# Patient Record
Sex: Female | Born: 1965 | Race: White | Hispanic: No | Marital: Married | Smoking: Never smoker
Health system: Southern US, Community
[De-identification: ages and names within clinical notes are randomized; demographics above are authoritative.]

---

## 2007-04-02 ENCOUNTER — Emergency Department: Payer: Self-pay | Admitting: Unknown Physician Specialty

## 2007-04-23 ENCOUNTER — Encounter: Payer: Self-pay | Admitting: Maternal & Fetal Medicine

## 2007-07-24 ENCOUNTER — Ambulatory Visit: Payer: Self-pay

## 2007-09-29 ENCOUNTER — Inpatient Hospital Stay: Payer: Self-pay | Admitting: Obstetrics and Gynecology

## 2009-06-29 DIAGNOSIS — Z833 Family history of diabetes mellitus: Secondary | ICD-10-CM | POA: Insufficient documentation

## 2015-11-05 ENCOUNTER — Other Ambulatory Visit: Payer: Self-pay | Admitting: Family Medicine

## 2015-11-05 ENCOUNTER — Telehealth: Payer: Self-pay | Admitting: Family Medicine

## 2015-11-05 DIAGNOSIS — G43909 Migraine, unspecified, not intractable, without status migrainosus: Secondary | ICD-10-CM

## 2015-11-05 MED ORDER — SUMATRIPTAN SUCCINATE 100 MG PO TABS: ORAL_TABLET | ORAL | Status: DC

## 2015-11-05 NOTE — Telephone Encounter (Signed)
Pt contacted office for refill request on the following medications:  Imitrex 100 mg to [facility] on [address] Pt stated that she is completely out of the medication and she doesn't think she can make it through Christmas with her head ache and no medication. Thanks TNP

## 2015-11-05 NOTE — Telephone Encounter (Signed)
Not in our system epic or alscripts-aa

## 2015-11-05 NOTE — Telephone Encounter (Signed)
Refills sent in

## 2015-11-05 NOTE — Telephone Encounter (Signed)
This is our pt she was last seen on 11/25/13. Pt was Miranda LoboDeena Rangel she is now Miranda Rangel b/c she remarried. She is in [employer] as Miranda Rangel.  I updated her info when she called this morning for the refill. When I spoke with pt this morning she stated that she would schedule an appt after the new year but would like enough to get her through Christmas. Thanks TNP

## 2015-11-05 NOTE — Telephone Encounter (Signed)
Please advise. KW 

## 2015-11-05 NOTE — Telephone Encounter (Signed)
I don't see that she is a patient here.

## 2016-08-24 ENCOUNTER — Other Ambulatory Visit: Payer: Self-pay | Admitting: Family Medicine

## 2016-08-24 DIAGNOSIS — G43909 Migraine, unspecified, not intractable, without status migrainosus: Secondary | ICD-10-CM

## 2016-08-24 NOTE — Telephone Encounter (Signed)
Provider is out of office can you please review over request? Thank you. KW 

## 2017-02-27 ENCOUNTER — Other Ambulatory Visit: Payer: Self-pay | Admitting: Physician Assistant

## 2017-02-27 DIAGNOSIS — G43909 Migraine, unspecified, not intractable, without status migrainosus: Secondary | ICD-10-CM

## 2019-01-17 ENCOUNTER — Other Ambulatory Visit: Payer: Self-pay | Admitting: Obstetrics and Gynecology

## 2019-01-17 DIAGNOSIS — Z1231 Encounter for screening mammogram for malignant neoplasm of breast: Secondary | ICD-10-CM

## 2019-01-22 ENCOUNTER — Encounter: Payer: Self-pay | Admitting: Radiology

## 2019-01-22 ENCOUNTER — Ambulatory Visit
Admission: RE | Admit: 2019-01-22 | Discharge: 2019-01-22 | Disposition: A | Discharge status: Home / Self Care | Source: Ambulatory Visit | Attending: Obstetrics and Gynecology | Admitting: Obstetrics and Gynecology

## 2019-01-22 DIAGNOSIS — Z1231 Encounter for screening mammogram for malignant neoplasm of breast: Secondary | ICD-10-CM | POA: Diagnosis not present

## 2019-08-06 IMAGING — MG DIGITAL SCREENING BILATERAL MAMMOGRAM WITH TOMO AND CAD
6 of 10 series · 6 of 30 positions shown · non-contrast
Comparison: 07/08/2015 and 12/25/2012 from [facility].

CLINICAL DATA: Screening.

EXAM:
DIGITAL SCREENING BILATERAL MAMMOGRAM WITH TOMO AND CAD

[R MLO synth-2D (1 of 2)]
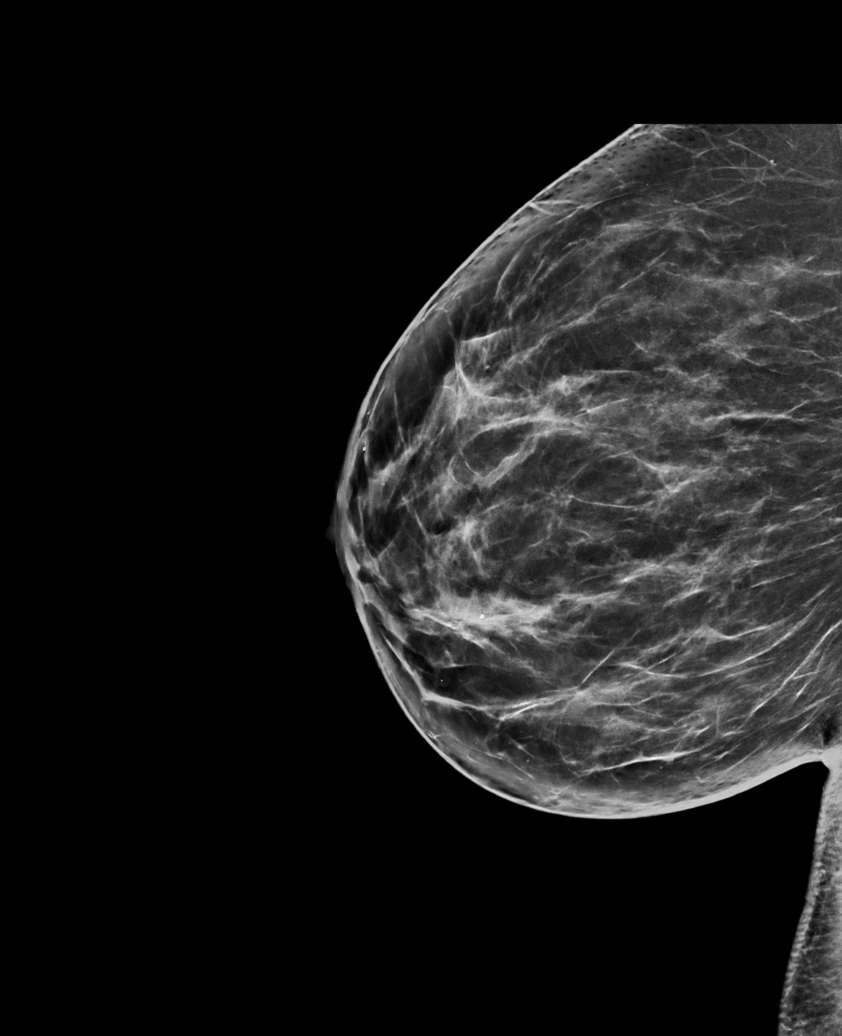

[L MLO synth-2D]
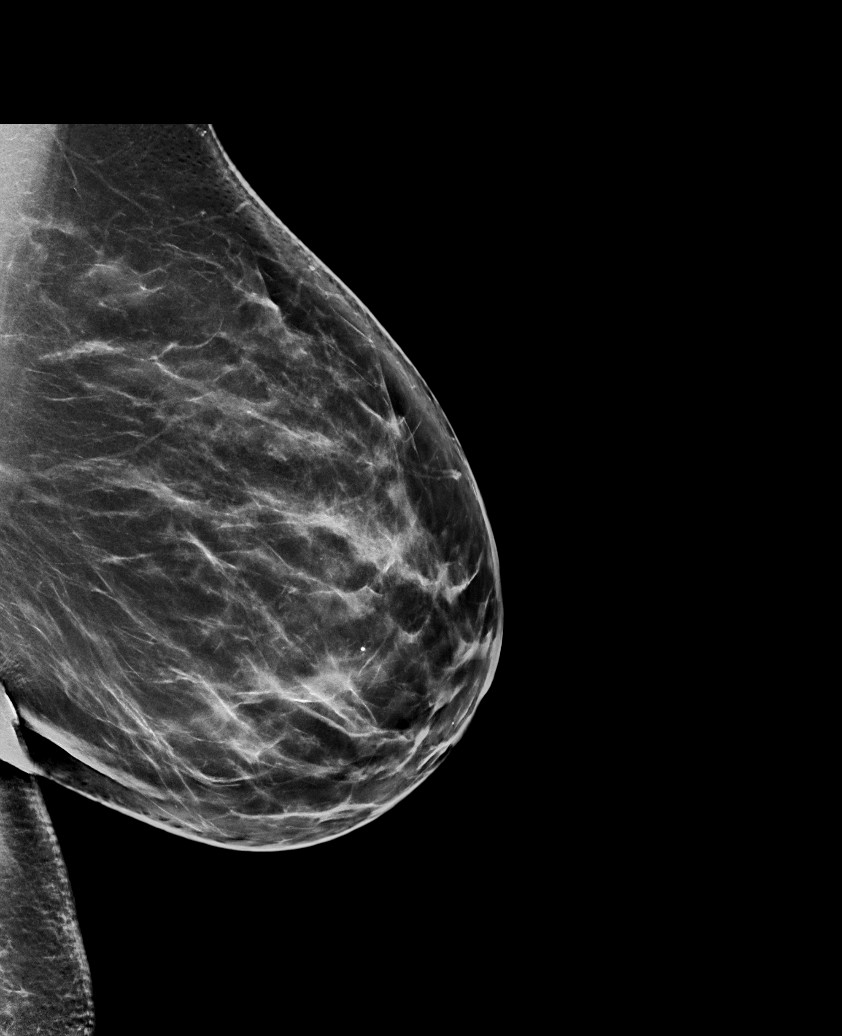

[R MLO synth-2D (2 of 2)]
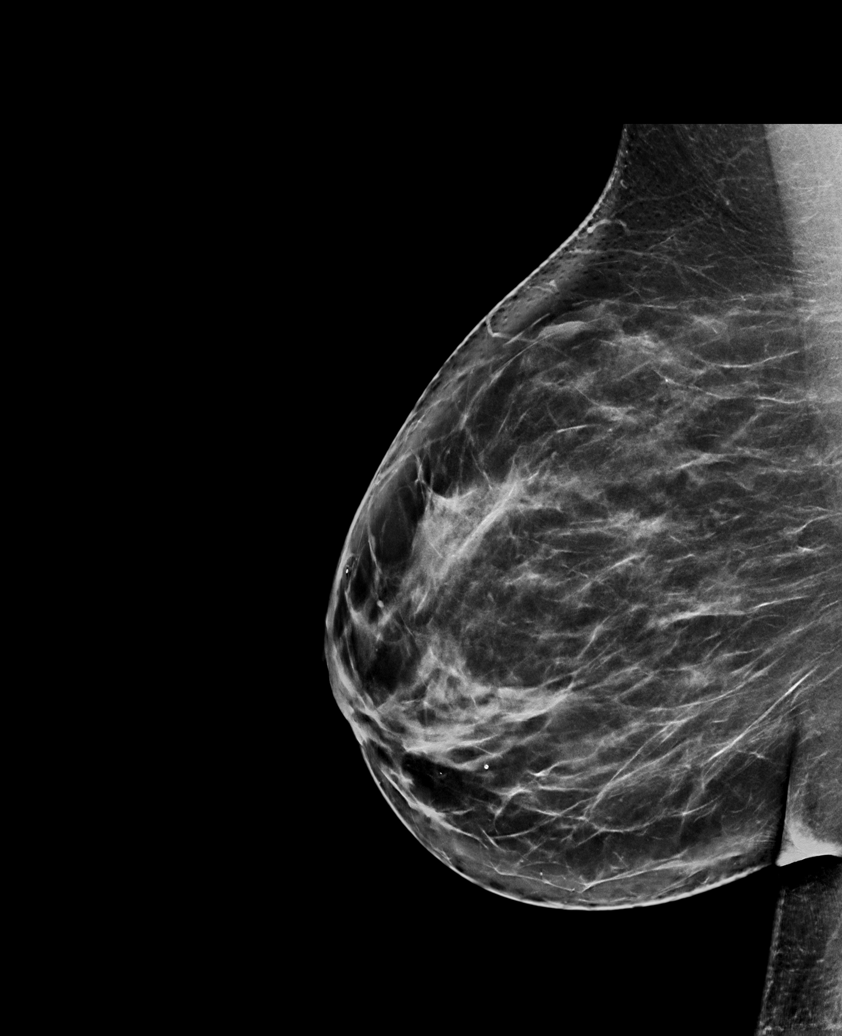

[L CC synth-2D]
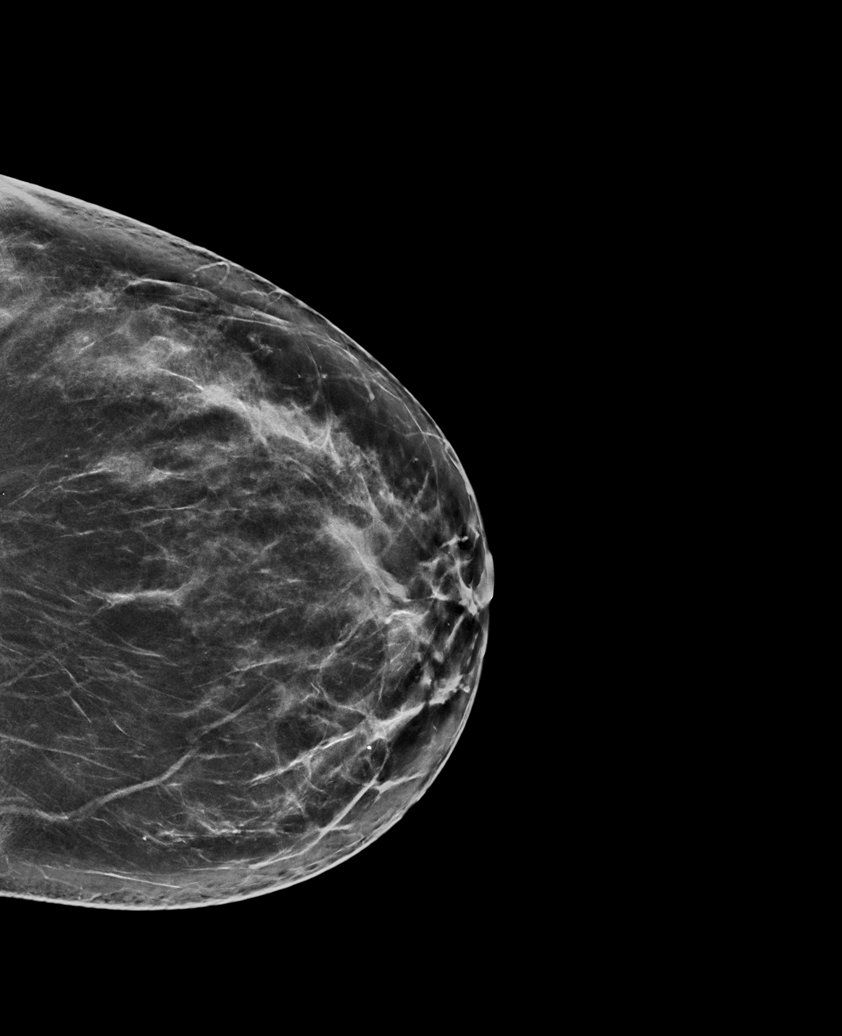

[R CC synth-2D]
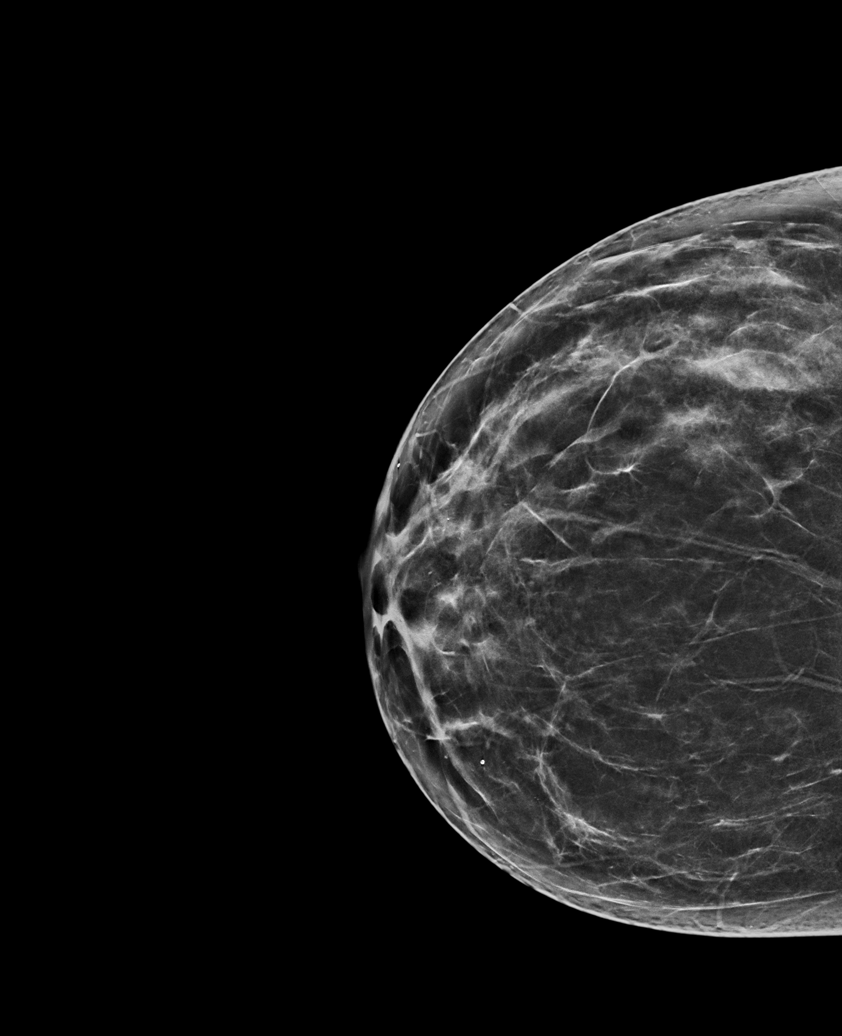

[L MLO tomo · tomo slice 47/93.0]
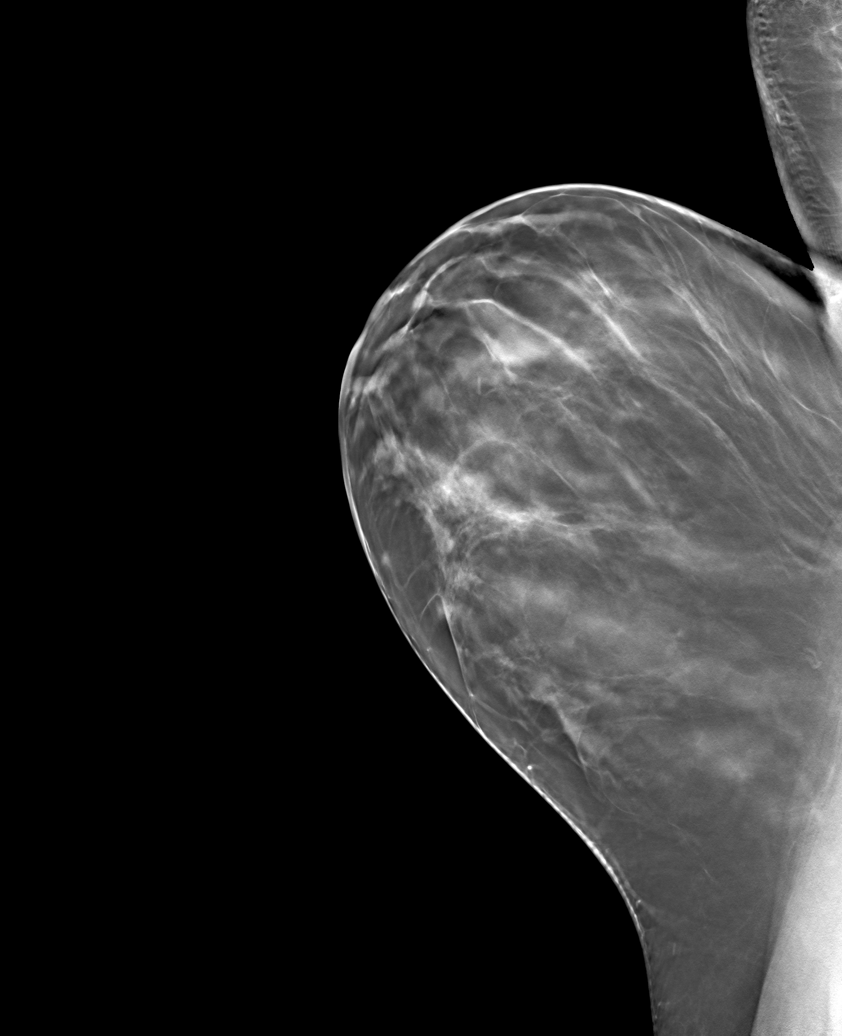

[6 of 30 positions shown; findings below may reference images not displayed]

Awaiting
the prior outside mammograms accounts for the delay in this report.

ACR Breast Density Category c: The breast tissue is heterogeneously
dense, which may obscure small masses.
FINDINGS: There are no findings suspicious for malignancy. Images were
processed with CAD.
IMPRESSION: No mammographic evidence of malignancy. A result letter of this
screening mammogram will be mailed directly to the patient.

RECOMMENDATION:
Screening mammogram in one year. (Code:YZ-[id number])

BI-RADS CATEGORY  1: Negative.

## 2019-08-27 ENCOUNTER — Other Ambulatory Visit: Payer: Self-pay

## 2019-08-27 DIAGNOSIS — Z20822 Contact with and (suspected) exposure to covid-19: Secondary | ICD-10-CM

## 2019-08-29 LAB — NOVEL CORONAVIRUS, NAA: SARS-CoV-2, NAA: NOT DETECTED

## 2020-03-08 ENCOUNTER — Emergency Department
Admission: EM | Admit: 2020-03-08 | Discharge: 2020-03-08 | Disposition: A | Discharge status: Home / Self Care | Attending: Emergency Medicine | Admitting: Emergency Medicine

## 2020-03-08 ENCOUNTER — Other Ambulatory Visit: Payer: Self-pay

## 2020-03-08 DIAGNOSIS — R509 Fever, unspecified: Secondary | ICD-10-CM | POA: Insufficient documentation

## 2020-03-08 DIAGNOSIS — R110 Nausea: Secondary | ICD-10-CM | POA: Insufficient documentation

## 2020-03-08 DIAGNOSIS — R109 Unspecified abdominal pain: Secondary | ICD-10-CM

## 2020-03-08 LAB — COMPREHENSIVE METABOLIC PANEL
ALT: 30 U/L (ref 0–44)
AST: 20 U/L (ref 15–41)
Albumin: 4.1 g/dL (ref 3.5–5.0)
Alkaline Phosphatase: 48 U/L (ref 38–126)
Anion gap: 9 (ref 5–15)
BUN: 11 mg/dL (ref 6–20)
CO2: 22 mmol/L (ref 22–32)
Calcium: 8.9 mg/dL (ref 8.9–10.3)
Chloride: 106 mmol/L (ref 98–111)
Creatinine, Ser: 0.9 mg/dL (ref 0.44–1.00)
GFR calc Af Amer: >60 mL/min (ref >60)
GFR calc non Af Amer: >60 mL/min (ref >60)
Glucose, Bld: 121 mg/dL — ABNORMAL HIGH (ref 70–99)
Potassium: 3.8 mmol/L (ref 3.5–5.1)
Sodium: 137 mmol/L (ref 135–145)
Total Bilirubin: 0.9 mg/dL (ref 0.3–1.2)
Total Protein: 8 g/dL (ref 6.5–8.1)

## 2020-03-08 LAB — CBC
HCT: 45.8 % (ref 36.0–46.0)
Hemoglobin: 15.7 g/dL — ABNORMAL HIGH (ref 12.0–15.0)
MCH: 27.4 pg (ref 26.0–34.0)
MCHC: 34.3 g/dL (ref 30.0–36.0)
MCV: 80.1 fL (ref 80.0–100.0)
Platelets: 377 10*3/uL (ref 150–400)
RBC: 5.72 MIL/uL — ABNORMAL HIGH (ref 3.87–5.11)
RDW: 13.4 % (ref 11.5–15.5)
WBC: 11.7 10*3/uL — ABNORMAL HIGH (ref 4.0–10.5)
nRBC: 0 % (ref 0.0–0.2)

## 2020-03-08 LAB — LIPASE, BLOOD: Lipase: 24 U/L (ref 11–51)

## 2020-03-08 MED ORDER — MORPHINE SULFATE (PF) 4 MG/ML IV SOLN
4.0000 mg | Freq: Once | INTRAVENOUS | Status: DC
  Filled 2020-03-08: qty 1

## 2020-03-08 MED ORDER — ONDANSETRON HCL 4 MG PO TABS: 4.0000 mg | ORAL_TABLET | Freq: Every day | ORAL | 1 refills | Status: AC | PRN

## 2020-03-08 MED ORDER — ONDANSETRON 4 MG PO TBDP
4.0000 mg | ORAL_TABLET | Freq: Once | ORAL | Status: AC
  Administered 2020-03-08: 4 mg via ORAL
  Filled 2020-03-08: qty 1

## 2020-03-08 MED ORDER — SODIUM CHLORIDE 0.9 % IV SOLN: Freq: Once | INTRAVENOUS | Status: DC

## 2020-03-08 MED ORDER — OXYCODONE-ACETAMINOPHEN 5-325 MG PO TABS
2.0000 | ORAL_TABLET | Freq: Once | ORAL | Status: AC
  Administered 2020-03-08: 2 via ORAL
  Filled 2020-03-08: qty 2

## 2020-03-08 MED ORDER — ONDANSETRON HCL 4 MG/2ML IJ SOLN
4.0000 mg | Freq: Once | INTRAMUSCULAR | Status: DC
  Filled 2020-03-08: qty 2

## 2020-03-08 NOTE — ED Notes (Signed)
This RN attempted to insert IV and give ordered medications, pt refused medication and IV, states that she doesn't need CT scan and believes that she only has a stomach bug. This RN notified Dr. Mayford Knife.

## 2020-03-08 NOTE — ED Provider Notes (Signed)
[facility] Emergency Department Provider Note       Time seen: ----------------------------------------- 7:56 PM on 03/08/2020 -----------------------------------------   I have reviewed the triage vital signs and the nursing notes.  HISTORY   Chief Complaint Abdominal Pain    HPI Miranda Rangel is a 54 y.o. female with no known past medical history who presents to the ED for severe left-sided flank pain with nausea and fever.  Patient states it feels as though she needs to have a bowel movement.  Discomfort is 5 out of 10 that she describes as cramping and constant.  History reviewed. No pertinent past medical history.  Patient Active Problem List   Diagnosis Date Noted  . Family history of diabetes mellitus 06/29/2009  . Acute onset aura migraine 06/29/2009  . Tobacco use 06/29/2009    History reviewed. No pertinent surgical history.  Allergies Penicillins and Sulfa antibiotics  Social History Social History   Tobacco Use  . Smoking status: Never Smoker  . Smokeless tobacco: Never Used  Substance Use Topics  . Alcohol use: Never  . Drug use: Never    Review of Systems Constitutional: Positive for fever Cardiovascular: Negative for chest pain. Respiratory: Negative for shortness of breath. Gastrointestinal: Positive for abdominal pain and nausea Musculoskeletal: Positive for back pain Skin: Negative for rash. Neurological: Negative for headaches, focal weakness or numbness.  All systems negative/normal/unremarkable except as stated in the HPI  ____________________________________________   PHYSICAL EXAM:  VITAL SIGNS: ED Triage Vitals  Enc Vitals Group     BP 03/08/20 1738 (!) 150/97     Pulse Rate 03/08/20 1738 (!) 134     Resp 03/08/20 1738 20     Temp 03/08/20 1738 98.8 F (37.1 C)     Temp Source 03/08/20 1738 Oral     SpO2 03/08/20 1738 96 %     Weight 03/08/20 1747 190 lb (86.2 kg)     Height 03/08/20 1747 5'  4" (1.626 m)     Head Circumference --      Peak Flow --      Pain Score 03/08/20 1747 5     Pain Loc --      Pain Edu? --      Excl. in [place]? --     Constitutional: Alert and oriented. Well appearing and in no distress. Eyes: Conjunctivae are normal. Normal extraocular movements. Cardiovascular: Rapid rate, regular rhythm. No murmurs, rubs, or gallops. Respiratory: Normal respiratory effort without tachypnea nor retractions. Breath sounds are clear and equal bilaterally. No wheezes/rales/rhonchi. Gastrointestinal: Left flank tenderness, no rebound or guarding.  Normal bowel sounds. Musculoskeletal: Nontender with normal range of motion in extremities. No lower extremity tenderness nor edema. Neurologic:  Normal speech and language. No gross focal neurologic deficits are appreciated.  Skin:  Skin is warm, dry and intact. No rash noted. Psychiatric: Mood and affect are normal. Speech and behavior are normal.  ____________________________________________  EKG: Interpreted by me.  Sinus tachycardia with a rate of 132 bpm, left bundle branch block, normal axis, borderline QT  ____________________________________________  ED COURSE:  As part of my medical decision making, I reviewed the following data within the [place] History obtained from family if available, nursing notes, old chart and ekg, as well as notes from prior ED visits. Patient presented for flank pain, we will assess with labs and imaging as indicated at this time.   Procedures  Arraya Joanna Borawski was evaluated in Emergency Department on 03/08/2020  for the symptoms described in the history of present illness. She was evaluated in the context of the global COVID-19 pandemic, which necessitated consideration that the patient might be at risk for infection with the SARS-CoV-2 virus that causes COVID-19. Institutional protocols and algorithms that pertain to the evaluation of patients at risk for COVID-19 are in  a state of rapid change based on information released by regulatory bodies including the CDC and federal and state organizations. These policies and algorithms were followed during the patient's care in the ED.  ____________________________________________   LABS (pertinent positives/negatives)  Labs Reviewed  COMPREHENSIVE METABOLIC PANEL - Abnormal; Notable for the following components:      Result Value   Glucose, Bld 121 (*)    All other components within normal limits  CBC - Abnormal; Notable for the following components:   WBC 11.7 (*)    RBC 5.72 (*)    Hemoglobin 15.7 (*)    All other components within normal limits  LIPASE, BLOOD  URINALYSIS, COMPLETE (UACMP) WITH MICROSCOPIC    RADIOLOGY  CT of the abdomen and pelvis with contrast was ordered Patient has declined her CT ____________________________________________   DIFFERENTIAL DIAGNOSIS   Renal colic, UTI, pyelonephritis, diverticulitis, AAA, colitis  FINAL ASSESSMENT AND PLAN  Flank pain   Plan: The patient had presented for left-sided flank pain with nausea and fever. Patient's labs did reveal mild leukocytosis. Patient's imaging was declined by the patient because she is self-pay.  She is tachycardic and seem to be having significant symptoms but she has declined any further testing at this point.  I have advised that she return for worsening or worrisome symptoms.   Ulice Dash, MD    Note: This note was generated in part or whole with voice recognition software. Voice recognition is usually quite accurate but there are transcription errors that can and very often do occur. I apologize for any typographical errors that were not detected and corrected.     Emily Filbert, MD 03/08/20 2138

## 2020-03-08 NOTE — ED Triage Notes (Signed)
Pt reports abd pain since 12noon with nausea and fever - the abd pain is centered around her umbilicus and makes pt feel as though she has to have a BM - Pt reports that the pain radiates straight through to the center of her back

## 2020-03-08 NOTE — ED Notes (Signed)
Pt observed in lobby drinking mountain dew. No distress noted at this time

## 2022-12-21 ENCOUNTER — Telehealth: Payer: Self-pay

## 2022-12-21 NOTE — Telephone Encounter (Signed)
Called pt to confirm appointment for tomorrow.  Left message for pt to call and confirm appointment by 1500 12/23/22

## 2022-12-23 ENCOUNTER — Ambulatory Visit: Admitting: Family Medicine

## 2022-12-23 ENCOUNTER — Encounter: Payer: Self-pay | Admitting: Family Medicine

## 2022-12-23 VITALS — BP 147/86 | HR 82 | Wt 192.2 lb

## 2022-12-23 DIAGNOSIS — Z1231 Encounter for screening mammogram for malignant neoplasm of breast: Secondary | ICD-10-CM | POA: Diagnosis not present

## 2022-12-23 DIAGNOSIS — Z79890 Hormone replacement therapy (postmenopausal): Secondary | ICD-10-CM

## 2022-12-23 MED ORDER — MEDROXYPROGESTERONE ACETATE 5 MG PO TABS: 5.0000 mg | ORAL_TABLET | Freq: Every day | ORAL | 3 refills | Status: DC

## 2022-12-23 MED ORDER — ESTROGENS CONJUGATED 0.3 MG PO TABS: 0.3000 mg | ORAL_TABLET | Freq: Every day | ORAL | 3 refills | Status: DC

## 2022-12-23 NOTE — Progress Notes (Signed)
   GYNECOLOGY PROBLEM  VISIT ENCOUNTER NOTE  Subjective:   Miranda Rangel is a 57 y.o. No obstetric history on file. female here for a problem GYN visit.  Current complaints: here for discussed HRT. Reports hot flashes, weight gain, insomnia.  No family or personal history of breast cancer. No history of abnormal pap smears.  Denies abnormal vaginal bleeding, discharge, pelvic pain, problems with intercourse or other gynecologic concerns.    Gynecologic History No LMP recorded. (Menstrual status: Perimenopausal).  Contraception:  Perimenopausal  Health Maintenance Due  Topic Date Due   COVID-19 Vaccine (1) Never done   HIV Screening  Never done   Hepatitis C Screening  Never done   PAP SMEAR-Modifier  Never done   COLONOSCOPY (Pts 45-13yr Insurance coverage will need to be confirmed)  Never done   Zoster Vaccines- Shingrix (1 of 2) Never done   MAMMOGRAM  01/21/2021   DTaP/Tdap/Td (2 - Td or Tdap) 09/21/2021   INFLUENZA VACCINE  06/14/2022    The following portions of the patient's history were reviewed and updated as appropriate: allergies, current medications, past family history, past medical history, past social history, past surgical history and problem list.  Review of Systems Pertinent items are noted in HPI.   Objective:  BP (!) 147/86   Pulse 82   Wt 192 lb 3.2 oz (87.2 kg)   BMI 32.99 kg/m  Gen: well appearing, NAD HEENT: no scleral icterus CV: RR Lung: Normal WOB Ext: warm well perfused  PELVIC: deferred and prefers to have pap at next follow up visit.    Assessment and Plan:  1. Encounter for screening mammogram for malignant neoplasm of breast - MM 3D SCREEN BREAST BILATERAL; Future  2. Hormone replacement therapy (postmenopausal) Discussed r/b of HRT  Discussed treatment to minimum effective dose Reviewed need for mammogram and pap screenings. Reviewed in detail risk of endometrial abnormality is need for protective progesterone  Will do pap at  next visit after some estrogen add back therapy for comfort as penetration is painful - estrogens, conjugated, (PREMARIN) 0.3 MG tablet; Take 1 tablet (0.3 mg total) by mouth daily. Take daily for 21 days then do not take for 7 days.  Dispense: 30 tablet; Refill: 3 - medroxyPROGESTERone (PROVERA) 5 MG tablet; Take 1 tablet (5 mg total) by mouth daily. Use for twelve consecutive days every month while on estrogen therapy  Dispense: 30 tablet; Refill: 3  Please refer to After Visit Summary for other counseling recommendations.   Return in about 3 months (around 03/23/2023) for Follow up HRT.  No future appointments.  KCaren Macadam MD, MPH, ABFM Attending [facility]

## 2022-12-23 NOTE — Progress Notes (Signed)
CC: Discuss menopause related issues: Night sweats, weight gain, Hormone replacement, Painful with intercourse

## 2023-03-29 ENCOUNTER — Ambulatory Visit
Admission: RE | Admit: 2023-03-29 | Discharge: 2023-03-29 | Disposition: A | Discharge status: Home / Self Care | Source: Ambulatory Visit | Attending: Family Medicine | Admitting: Family Medicine

## 2023-03-29 DIAGNOSIS — Z1231 Encounter for screening mammogram for malignant neoplasm of breast: Secondary | ICD-10-CM | POA: Insufficient documentation

## 2023-06-05 ENCOUNTER — Other Ambulatory Visit: Payer: Self-pay | Admitting: Family Medicine

## 2023-06-05 DIAGNOSIS — Z79890 Hormone replacement therapy (postmenopausal): Secondary | ICD-10-CM

## 2023-07-16 ENCOUNTER — Other Ambulatory Visit: Payer: Self-pay | Admitting: Family Medicine

## 2023-07-16 DIAGNOSIS — Z79890 Hormone replacement therapy (postmenopausal): Secondary | ICD-10-CM

## 2023-08-21 ENCOUNTER — Other Ambulatory Visit: Payer: Self-pay | Admitting: Family Medicine

## 2023-08-21 DIAGNOSIS — Z79890 Hormone replacement therapy (postmenopausal): Secondary | ICD-10-CM

## 2023-08-28 ENCOUNTER — Other Ambulatory Visit: Payer: Self-pay | Admitting: Family Medicine

## 2023-08-28 DIAGNOSIS — Z79890 Hormone replacement therapy (postmenopausal): Secondary | ICD-10-CM

## 2024-02-02 ENCOUNTER — Encounter: Payer: Self-pay | Admitting: Family Medicine

## 2024-02-02 ENCOUNTER — Ambulatory Visit: Admitting: Family Medicine

## 2024-02-02 ENCOUNTER — Other Ambulatory Visit (HOSPITAL_COMMUNITY)
Admission: RE | Admit: 2024-02-02 | Discharge: 2024-02-02 | Disposition: A | Discharge status: Home / Self Care | Source: Ambulatory Visit | Attending: Family Medicine | Admitting: Family Medicine

## 2024-02-02 VITALS — BP 143/89 | HR 76 | Ht 64.0 in | Wt 190.0 lb

## 2024-02-02 DIAGNOSIS — Z124 Encounter for screening for malignant neoplasm of cervix: Secondary | ICD-10-CM

## 2024-02-02 DIAGNOSIS — Z79890 Hormone replacement therapy (postmenopausal): Secondary | ICD-10-CM | POA: Diagnosis not present

## 2024-02-02 DIAGNOSIS — Z01419 Encounter for gynecological examination (general) (routine) without abnormal findings: Secondary | ICD-10-CM | POA: Diagnosis not present

## 2024-02-02 DIAGNOSIS — Z1339 Encounter for screening examination for other mental health and behavioral disorders: Secondary | ICD-10-CM | POA: Diagnosis not present

## 2024-02-02 MED ORDER — MEDROXYPROGESTERONE ACETATE 5 MG PO TABS: 5.0000 mg | ORAL_TABLET | Freq: Every day | ORAL | 12 refills | Status: AC

## 2024-02-02 MED ORDER — ESTROGENS CONJUGATED 0.3 MG PO TABS: 0.3000 mg | ORAL_TABLET | Freq: Every day | ORAL | 11 refills | Status: AC

## 2024-02-02 NOTE — Progress Notes (Signed)
   GYNECOLOGY PROBLEM  VISIT ENCOUNTER NOTE  Subjective:   Miranda Rangel is a 58 y.o. G40P2012 female here for a problem GYN visit.  Current complaints: needs refills of HRT. Feels it helped with symptoms- minimal hot flashes, improves irritability, vaginal dryness was much improved and sex was more comfortable. Sexually active with female husband. Has 16yo at home. Some illnesses in family that meant she delayed some care for her self.  NO pelvic concern, no bloating. Denies abnormal vaginal bleeding, discharge, pelvic pain, problems with intercourse or other gynecologic concerns.    Gynecologic History No LMP recorded. (Menstrual status: Perimenopausal).  Contraception: post menopausal status  Health Maintenance Due  Topic Date Due   HIV Screening  Never done   Hepatitis C Screening  Never done   Cervical Cancer Screening (HPV/Pap Cotest)  Never done   Colonoscopy  Never done   Zoster Vaccines- Shingrix (1 of 2) Never done   DTaP/Tdap/Td (2 - Td or Tdap) 09/21/2021   INFLUENZA VACCINE  06/15/2023   COVID-19 Vaccine (1 - 2024-25 season) Never done    The following portions of the patient's history were reviewed and updated as appropriate: allergies, current medications, past family history, past medical history, past social history, past surgical history and problem list.  Review of Systems Pertinent items are noted in HPI.   Objective:  BP (!) 143/89   Pulse 76   Ht 5\' 4"  (1.626 m)   Wt 190 lb (86.2 kg)   BMI 32.61 kg/m   Gen: well appearing, NAD HEENT: no scleral icterus CV: RR Lung: Normal WOB Ext: warm well perfused  Breast: symmetric, everted nipples bilaterally. Right and left breast with fibrocystic changes/dense tissue. No predominant cysts or masses. Non-tender to palpation. No nipple discharge bilaterally.  PELVIC: Normal appearing external genitalia; normal appearing vaginal mucosa and cervix.  No abnormal discharge noted.  Pap smear obtained.  Normal uterine  size, no other palpable masses, no uterine or adnexal tenderness.   Assessment and Plan:  1. Hormone replacement therapy (postmenopausal) 84132 Reviewed r/b and patient wants to continued Pap today Mammogram in May - medroxyPROGESTERone (PROVERA) 5 MG tablet; Take 1 tablet (5 mg total) by mouth daily. TAKE 1 TABLET BY MOUTH DAILY. USE FOR TWELVE CONSECUTIVE DAYS EVERY MONTH WHILE ON ESTROGEN THERAPY  Dispense: 30 tablet; Refill: 12 - estrogens, conjugated, (PREMARIN) 0.3 MG tablet; Take 1 tablet (0.3 mg total) by mouth daily. Take daily for 21 days then do not take for 7 days.TAKE 1 TABLET BY MOUTH DAILY FOR 21 DAYS, THEN DO NOT TAKE FOR 7 DAYS  Dispense: 30 tablet; Refill: 11  2. Cervical cancer screening - Cytology - PAP  3. Well woman exam with routine gynecological exam (Primary) CRC screening through PCP Due for pcp visit BP was elevated, strong family hx of HTN- encouraged PCP visit Referred for mammogram today to be scheduled at [facility].  Please refer to After Visit Summary for other counseling recommendations.   No follow-ups on file.  Federico Flake, MD, MPH, ABFM Attending Physician Faculty [facility]- [facility]

## 2024-02-02 NOTE — Progress Notes (Signed)
 Patient presents for Annual.Last seen 12/2022 discussed HRT.   Last pap:  >3 yrs  Mammogram:  03/29/2023 WNL NO Family Hx of Breast Cancer STD Screening: Declines   Needs HRT Rx refilled. Notes medication worked [employer].

## 2024-02-05 LAB — CYTOLOGY - PAP
Comment: NEGATIVE
Diagnosis: NEGATIVE
Diagnosis: REACTIVE
High risk HPV: NEGATIVE

## 2024-02-06 ENCOUNTER — Encounter: Payer: Self-pay | Admitting: Family Medicine

## 2024-04-30 LAB — COLOGUARD: COLOGUARD: POSITIVE — AB

## 2024-06-19 ENCOUNTER — Ambulatory Visit

## 2024-06-19 DIAGNOSIS — Z1211 Encounter for screening for malignant neoplasm of colon: Secondary | ICD-10-CM | POA: Diagnosis present

## 2024-06-19 DIAGNOSIS — K5730 Diverticulosis of large intestine without perforation or abscess without bleeding: Secondary | ICD-10-CM | POA: Diagnosis not present

## 2024-06-19 DIAGNOSIS — R195 Other fecal abnormalities: Secondary | ICD-10-CM | POA: Diagnosis not present

## 2024-06-19 DIAGNOSIS — K648 Other hemorrhoids: Secondary | ICD-10-CM | POA: Diagnosis not present

## 2025-02-21 ENCOUNTER — Ambulatory Visit: Admitting: Family Medicine
